# Patient Record
Sex: Male | Born: 1995 | Race: White | Hispanic: No | Marital: Single
Health system: Southern US, Community
[De-identification: ages and names within clinical notes are randomized; demographics above are authoritative.]

## PROBLEM LIST (undated history)

## (undated) ENCOUNTER — Emergency Department (HOSPITAL_COMMUNITY): Payer: Self-pay

---

## 2019-03-01 ENCOUNTER — Emergency Department (HOSPITAL_COMMUNITY): Payer: Self-pay

## 2019-03-01 ENCOUNTER — Encounter (HOSPITAL_COMMUNITY): Payer: Self-pay

## 2019-03-01 ENCOUNTER — Emergency Department (HOSPITAL_COMMUNITY)
Admission: EM | Admit: 2019-03-01 | Discharge: 2019-03-01 | Disposition: A | Discharge status: Home / Self Care | Payer: Self-pay | Attending: Emergency Medicine | Admitting: Emergency Medicine

## 2019-03-01 ENCOUNTER — Other Ambulatory Visit: Payer: Self-pay

## 2019-03-01 DIAGNOSIS — Y9389 Activity, other specified: Secondary | ICD-10-CM | POA: Insufficient documentation

## 2019-03-01 DIAGNOSIS — Y929 Unspecified place or not applicable: Secondary | ICD-10-CM | POA: Insufficient documentation

## 2019-03-01 DIAGNOSIS — Y998 Other external cause status: Secondary | ICD-10-CM | POA: Insufficient documentation

## 2019-03-01 DIAGNOSIS — F1210 Cannabis abuse, uncomplicated: Secondary | ICD-10-CM | POA: Insufficient documentation

## 2019-03-01 DIAGNOSIS — F17200 Nicotine dependence, unspecified, uncomplicated: Secondary | ICD-10-CM | POA: Insufficient documentation

## 2019-03-01 DIAGNOSIS — S022XXA Fracture of nasal bones, initial encounter for closed fracture: Secondary | ICD-10-CM | POA: Insufficient documentation

## 2019-03-01 DIAGNOSIS — T1490XA Injury, unspecified, initial encounter: Secondary | ICD-10-CM

## 2019-03-01 DIAGNOSIS — F1510 Other stimulant abuse, uncomplicated: Secondary | ICD-10-CM | POA: Insufficient documentation

## 2019-03-01 LAB — BASIC METABOLIC PANEL
Anion gap: 14 (ref 5–15)
BUN: 5 mg/dL — ABNORMAL LOW (ref 6–20)
CO2: 22 mmol/L (ref 22–32)
Calcium: 9.8 mg/dL (ref 8.9–10.3)
Chloride: 105 mmol/L (ref 98–111)
Creatinine, Ser: 1.26 mg/dL — ABNORMAL HIGH (ref 0.61–1.24)
GFR calc Af Amer: >60 mL/min (ref >60)
GFR calc non Af Amer: >60 mL/min (ref >60)
Glucose, Bld: 97 mg/dL (ref 70–99)
Potassium: 3.9 mmol/L (ref 3.5–5.1)
Sodium: 141 mmol/L (ref 135–145)

## 2019-03-01 LAB — CBC WITH DIFFERENTIAL/PLATELET
Abs Immature Granulocytes: 0.05 10*3/uL (ref 0.00–0.07)
Basophils Absolute: 0.1 10*3/uL (ref 0.0–0.1)
Basophils Relative: 1 %
Eosinophils Absolute: 0.3 10*3/uL (ref 0.0–0.5)
Eosinophils Relative: 2 %
HCT: 48 % (ref 39.0–52.0)
Hemoglobin: 15.8 g/dL (ref 13.0–17.0)
Immature Granulocytes: 0 %
Lymphocytes Relative: 10 %
Lymphs Abs: 1.2 10*3/uL (ref 0.7–4.0)
MCH: 28.7 pg (ref 26.0–34.0)
MCHC: 32.9 g/dL (ref 30.0–36.0)
MCV: 87.3 fL (ref 80.0–100.0)
Monocytes Absolute: 0.8 10*3/uL (ref 0.1–1.0)
Monocytes Relative: 6 %
Neutro Abs: 10 10*3/uL — ABNORMAL HIGH (ref 1.7–7.7)
Neutrophils Relative %: 81 %
Platelets: 226 10*3/uL (ref 150–400)
RBC: 5.5 MIL/uL (ref 4.22–5.81)
RDW: 13.2 % (ref 11.5–15.5)
WBC: 12.3 10*3/uL — ABNORMAL HIGH (ref 4.0–10.5)
nRBC: 0 % (ref 0.0–0.2)

## 2019-03-01 MED ORDER — LACTATED RINGERS IV BOLUS
1000.0000 mL | Freq: Once | INTRAVENOUS | Status: AC
  Administered 2019-03-01: 1000 mL via INTRAVENOUS

## 2019-03-01 MED ORDER — AMOXICILLIN-POT CLAVULANATE 875-125 MG PO TABS: 1.0000 | ORAL_TABLET | Freq: Two times a day (BID) | ORAL | 0 refills | Status: AC

## 2019-03-01 MED ORDER — OXYCODONE-ACETAMINOPHEN 5-325 MG PO TABS: 1.0000 | ORAL_TABLET | Freq: Once | ORAL | Status: DC

## 2019-03-01 MED ORDER — TETANUS-DIPHTH-ACELL PERTUSSIS 5-2.5-18.5 LF-MCG/0.5 IM SUSP: 0.5000 mL | Freq: Once | INTRAMUSCULAR | Status: DC

## 2019-03-01 MED ORDER — FENTANYL CITRATE (PF) 100 MCG/2ML IJ SOLN: 50.0000 ug | Freq: Once | INTRAMUSCULAR | Status: DC
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
## 2019-03-01 NOTE — Discharge Instructions (Signed)
Please take Tylenol or Motrin for any further pain.
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
## 2019-03-07 ENCOUNTER — Encounter (HOSPITAL_COMMUNITY): Payer: Self-pay | Admitting: Emergency Medicine

## 2019-03-07 ENCOUNTER — Other Ambulatory Visit: Payer: Self-pay

## 2019-03-07 ENCOUNTER — Emergency Department (HOSPITAL_COMMUNITY)
Admission: EM | Admit: 2019-03-07 | Discharge: 2019-03-07 | Disposition: A | Discharge status: Home / Self Care | Payer: BLUE CROSS/BLUE SHIELD | Attending: Emergency Medicine | Admitting: Emergency Medicine

## 2019-03-07 DIAGNOSIS — Z5321 Procedure and treatment not carried out due to patient leaving prior to being seen by health care provider: Secondary | ICD-10-CM | POA: Insufficient documentation

## 2019-03-07 DIAGNOSIS — R21 Rash and other nonspecific skin eruption: Secondary | ICD-10-CM | POA: Diagnosis not present

## 2019-03-07 NOTE — ED Notes (Signed)
Advised patient to stay, patient decided to leave.  Patient stated, " I need to find a way to make 300$, I don't have the kind of time to wait to be seen."
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
## 2019-03-08 ENCOUNTER — Other Ambulatory Visit: Payer: Self-pay

## 2019-03-08 ENCOUNTER — Emergency Department (HOSPITAL_COMMUNITY)
Admission: EM | Admit: 2019-03-08 | Discharge: 2019-03-08 | Disposition: A | Discharge status: Home / Self Care | Payer: BLUE CROSS/BLUE SHIELD | Attending: Emergency Medicine | Admitting: Emergency Medicine

## 2019-03-08 ENCOUNTER — Encounter (HOSPITAL_COMMUNITY): Payer: Self-pay | Admitting: Emergency Medicine

## 2019-03-08 DIAGNOSIS — J020 Streptococcal pharyngitis: Secondary | ICD-10-CM | POA: Diagnosis not present

## 2019-03-08 DIAGNOSIS — F1210 Cannabis abuse, uncomplicated: Secondary | ICD-10-CM | POA: Diagnosis not present

## 2019-03-08 DIAGNOSIS — F17210 Nicotine dependence, cigarettes, uncomplicated: Secondary | ICD-10-CM | POA: Insufficient documentation

## 2019-03-08 DIAGNOSIS — R21 Rash and other nonspecific skin eruption: Secondary | ICD-10-CM | POA: Diagnosis present

## 2019-03-08 LAB — GROUP A STREP BY PCR: Group A Strep by PCR: NOT DETECTED

## 2019-03-08 LAB — BASIC METABOLIC PANEL
Anion gap: 11 (ref 5–15)
BUN: 6 mg/dL (ref 6–20)
CO2: 29 mmol/L (ref 22–32)
Calcium: 9.5 mg/dL (ref 8.9–10.3)
Chloride: 99 mmol/L (ref 98–111)
Creatinine, Ser: 0.96 mg/dL (ref 0.61–1.24)
GFR calc Af Amer: >60 mL/min (ref >60)
GFR calc non Af Amer: >60 mL/min (ref >60)
Glucose, Bld: 97 mg/dL (ref 70–99)
Potassium: 3.5 mmol/L (ref 3.5–5.1)
Sodium: 139 mmol/L (ref 135–145)

## 2019-03-08 LAB — CBC WITH DIFFERENTIAL/PLATELET
Abs Immature Granulocytes: 0.02 10*3/uL (ref 0.00–0.07)
Basophils Absolute: 0.1 10*3/uL (ref 0.0–0.1)
Basophils Relative: 1 %
Eosinophils Absolute: 0.2 10*3/uL (ref 0.0–0.5)
Eosinophils Relative: 2 %
HCT: 44.4 % (ref 39.0–52.0)
Hemoglobin: 15.3 g/dL (ref 13.0–17.0)
Immature Granulocytes: 0 %
Lymphocytes Relative: 18 %
Lymphs Abs: 1.3 10*3/uL (ref 0.7–4.0)
MCH: 29.1 pg (ref 26.0–34.0)
MCHC: 34.5 g/dL (ref 30.0–36.0)
MCV: 84.6 fL (ref 80.0–100.0)
Monocytes Absolute: 0.8 10*3/uL (ref 0.1–1.0)
Monocytes Relative: 10 %
Neutro Abs: 5.1 10*3/uL (ref 1.7–7.7)
Neutrophils Relative %: 69 %
Platelets: 182 10*3/uL (ref 150–400)
RBC: 5.25 MIL/uL (ref 4.22–5.81)
RDW: 12.8 % (ref 11.5–15.5)
WBC: 7.3 10*3/uL (ref 4.0–10.5)
nRBC: 0 % (ref 0.0–0.2)

## 2019-03-08 LAB — MONONUCLEOSIS SCREEN: Mono Screen: NEGATIVE

## 2019-03-08 MED ORDER — DIPHENHYDRAMINE HCL 50 MG/ML IJ SOLN
12.5000 mg | Freq: Once | INTRAMUSCULAR | Status: AC
  Administered 2019-03-08: 12.5 mg via INTRAVENOUS
  Filled 2019-03-08: qty 1

## 2019-03-08 MED ORDER — PENICILLIN G BENZATHINE 1200000 UNIT/2ML IM SUSP
1.2000 10*6.[IU] | Freq: Once | INTRAMUSCULAR | Status: AC
  Administered 2019-03-08: 1.2 10*6.[IU] via INTRAMUSCULAR
  Filled 2019-03-08: qty 2

## 2019-03-08 MED ORDER — SODIUM CHLORIDE 0.9 % IV BOLUS
1000.0000 mL | Freq: Once | INTRAVENOUS | Status: AC
  Administered 2019-03-08: 1000 mL via INTRAVENOUS

## 2019-03-08 MED ORDER — DEXAMETHASONE SODIUM PHOSPHATE 10 MG/ML IJ SOLN
10.0000 mg | Freq: Once | INTRAMUSCULAR | Status: AC
  Administered 2019-03-08: 10 mg via INTRAVENOUS
  Filled 2019-03-08: qty 1

## 2019-03-08 NOTE — ED Notes (Signed)
Called x 1 no answer
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
## 2019-03-08 NOTE — ED Notes (Signed)
IV attempted x 2 without success, second RN to try  

## 2019-03-08 NOTE — ED Notes (Signed)
Pt refused discharge vital signs

## 2019-03-08 NOTE — ED Triage Notes (Signed)
Pt to triage via [facility]> C/o fever 101.8, sore throat with swollen tonsils x 4-5 days and red, slightly raised rash x 2-3 days.  EMS administered Tylenol 1000mg  PTA.

## 2019-03-08 NOTE — Discharge Instructions (Addendum)
Continue Benadryl - 25 mg every 4-6 hours - as needed for itching until rash resolves.   You have been given all the antibiotic you need for the throat infection, which is what is causing the rash.   Return to the emergency department with any new or concerning symptoms. Push fluids. Tylenol for fever.
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
